# Patient Record
Sex: Male | Born: 1970 | Hispanic: No | Marital: Married | Smoking: Never smoker
Health system: Southern US, Community
[De-identification: ages and names within clinical notes are randomized; demographics above are authoritative.]

## PROBLEM LIST (undated history)

## (undated) DIAGNOSIS — Z789 Other specified health status: Secondary | ICD-10-CM

---

## 2018-01-27 ENCOUNTER — Emergency Department (HOSPITAL_COMMUNITY): Payer: Self-pay

## 2018-01-27 ENCOUNTER — Emergency Department (HOSPITAL_COMMUNITY)
Admission: EM | Admit: 2018-01-27 | Discharge: 2018-01-27 | Disposition: A | Discharge status: Home / Self Care | Payer: Self-pay | Attending: Emergency Medicine | Admitting: Emergency Medicine

## 2018-01-27 ENCOUNTER — Other Ambulatory Visit: Payer: Self-pay

## 2018-01-27 ENCOUNTER — Encounter (HOSPITAL_COMMUNITY): Payer: Self-pay

## 2018-01-27 DIAGNOSIS — J189 Pneumonia, unspecified organism: Secondary | ICD-10-CM | POA: Insufficient documentation

## 2018-01-27 HISTORY — DX: Other specified health status: Z78.9

## 2018-01-27 MED ORDER — BENZONATATE 100 MG PO CAPS: 100.0000 mg | ORAL_CAPSULE | Freq: Three times a day (TID) | ORAL | 0 refills | Status: DC

## 2018-01-27 MED ORDER — ALBUTEROL SULFATE HFA 108 (90 BASE) MCG/ACT IN AERS: 2.0000 | INHALATION_SPRAY | RESPIRATORY_TRACT | 3 refills | Status: AC | PRN

## 2018-01-27 MED ORDER — IBUPROFEN 800 MG PO TABS
800.0000 mg | ORAL_TABLET | Freq: Once | ORAL | Status: AC
  Administered 2018-01-27: 800 mg via ORAL
  Filled 2018-01-27: qty 1

## 2018-01-27 MED ORDER — AMOXICILLIN-POT CLAVULANATE 875-125 MG PO TABS: 1.0000 | ORAL_TABLET | Freq: Two times a day (BID) | ORAL | 0 refills | Status: AC

## 2018-01-27 MED ORDER — AMOXICILLIN-POT CLAVULANATE 875-125 MG PO TABS
1.0000 | ORAL_TABLET | Freq: Once | ORAL | Status: AC
  Administered 2018-01-27: 1 via ORAL
  Filled 2018-01-27: qty 1

## 2018-01-27 MED ORDER — BENZONATATE 100 MG PO CAPS
200.0000 mg | ORAL_CAPSULE | Freq: Once | ORAL | Status: AC
  Administered 2018-01-27: 200 mg via ORAL
  Filled 2018-01-27: qty 2

## 2018-01-27 NOTE — ED Provider Notes (Signed)
[facility] Provider [facility]   CSN: [id number] Arrival date & time: 01/27/18  1010     History   Chief Complaint Chief Complaint  Patient presents with  . Cough  . Fever    HPI Mason Barnes is a 47 y.o. male.  HPI  The patient is a 47 year old male, a language interpreter was used in the language of Arabic at the patient's request, the entire interview and exam was conducted using the interpreter.  The patient reports to me that he has no chronic lung conditions, he takes no daily medications, approximately 4 or 5 days ago the patient developed acute onset of coughing which is been persistent and gradually worsening.  He coughs consistently and constantly throughout the day and night, he often has sputum production and occasionally has some blood-tinged sputum.  He has had subjective fevers though he has not measured them and chills as well.  He does have some myalgias though this has improved and feels like it is more the pain associated with coughing in his bilateral abdomen.  No vomiting, no diarrhea, has taken over-the-counter medications with minimal relief.  He does report that he has a child who has recently been diagnosed with pneumonia who he has been around.  He denies any other sick contacts, recent travel or risk factors for pulmonary embolism.  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  Past Medical History:  Diagnosis Date  . Medical history non-contributory     There are no active problems to display for this patient.   History reviewed. No pertinent surgical history.      Home Medications    Prior to Admission medications   Medication Sig Start Date End Date Taking? Authorizing Provider  albuterol (PROVENTIL HFA;VENTOLIN HFA) 108 (90 Base) MCG/ACT inhaler Inhale 2 puffs into the lungs every 4 (four) hours as needed for wheezing or shortness of breath. 01/27/18   Eber HongMiller, Biannca Scantlin, MD  amoxicillin-clavulanate (AUGMENTIN)  875-125 MG tablet Take 1 tablet by mouth every 12 (twelve) hours. 01/27/18   Eber HongMiller, Cristin Szatkowski, MD  benzonatate (TESSALON) 100 MG capsule Take 1 capsule (100 mg total) by mouth every 8 (eight) hours. 01/27/18   Eber HongMiller, Oaklynn Stierwalt, MD    Family History History reviewed. No pertinent family history.  Social History Social History   Tobacco Use  . Smoking status: Never Smoker  . Smokeless tobacco: Never Used  Substance Use Topics  . Alcohol use: Not on file  . Drug use: Not on file     Allergies   Patient has no known allergies.   Review of Systems Review of Systems  All other systems reviewed and are negative.    Physical Exam Updated Vital Signs BP (!) 141/87   Pulse 96   Temp 99.2 F (37.3 C) (Oral)   Resp 18   SpO2 96%   Physical Exam  Constitutional: He appears well-developed and well-nourished. No distress.  HENT:  Head: Normocephalic and atraumatic.  Mouth/Throat: No oropharyngeal exudate.  Posterior pharynx is erythematous, no exudate asymmetry or hypertrophy of the tonsillar pillars, normal phonation  Eyes: Pupils are equal, round, and reactive to light. Conjunctivae and EOM are normal. Right eye exhibits no discharge. Left eye exhibits no discharge. No scleral icterus.  Neck: Normal range of motion. Neck supple. No JVD present. No thyromegaly present.  No thyromegaly or lymphadenopathy  Cardiovascular: Regular rhythm, normal heart sounds and intact distal pulses. Exam reveals no gallop and no friction rub.  No murmur heard.  Heart rate ranges from 95-105  Pulmonary/Chest: Effort normal and breath sounds normal. No respiratory distress. He has no wheezes. He has no rales.  Diffuse mild expiratory wheezing at end expiration, no increased work of breathing, no tachypnea, no rales  Abdominal: Soft. Bowel sounds are normal. He exhibits no distension and no mass. There is no tenderness.  Musculoskeletal: Normal range of motion. He exhibits no edema or tenderness.    Lymphadenopathy:    He has no cervical adenopathy.  Neurological: He is alert. Coordination normal.  Skin: Skin is warm and dry. No rash noted. No erythema.  Psychiatric: He has a normal mood and affect. His behavior is normal.  Nursing note and vitals reviewed.    ED Treatments / Results  Labs (all labs ordered are listed, but only abnormal results are displayed) Labs Reviewed - No data to display  EKG EKG Interpretation  Date/Time:  Tuesday January 27 2018 10:21:47 EST Ventricular Rate:  93 PR Interval:  144 QRS Duration: 78 QT Interval:  310 QTC Calculation: 385 R Axis:   65 Text Interpretation:  Normal sinus rhythm Septal infarct , age undetermined Abnormal ECG No old tracing to compare Confirmed by Eber Hong ([id number]) on 01/27/2018 11:23:19 AM   Radiology Dg Chest 2 View  Result Date: 01/27/2018 CLINICAL DATA:  Cough, shortness of breath, and intermittent fever for the past 4 days. Small amount of hemoptysis this morning. Nonsmoker. EXAM: CHEST - 2 VIEW COMPARISON:  None. FINDINGS: The lungs are adequately inflated. The interstitial markings are coarse bilaterally. There is no discrete infiltrate. The heart and pulmonary vascularity are normal. The trachea is midline. The bony thorax exhibits no acute abnormality. IMPRESSION: Mildly increased lung markings especially in the perihilar and infrahilar regions. This may be chronic or could reflect acute subsegmental atelectasis or early pneumonia. Correlation with clinical and laboratory values is needed. Follow-up radiographs following any antibiotic therapy would be useful to assure clearing. If the patient's hemoptysis persists or worsens, chest CT scanning may be the most useful next imaging step. Electronically Signed   By: David  [place] M.D.   On: 01/27/2018 10:49    Procedures Procedures (including critical care time)  Medications Ordered in ED Medications  benzonatate (TESSALON) capsule 200 mg (has no  administration in time range)  amoxicillin-clavulanate (AUGMENTIN) 875-125 MG per tablet 1 tablet (has no administration in time range)  ibuprofen (ADVIL,MOTRIN) tablet 800 mg (has no administration in time range)     Initial Impression / Assessment and Plan / ED Course  I have reviewed the triage vital signs and the nursing notes.  Pertinent labs & imaging results that were available during my care of the patient were reviewed by me and considered in my medical decision making (see chart for details).     The patient's EKG is unremarkable, his chest x-ray shows some hilar markings, there is no obvious infiltrates however with the patient's story of a progressive respiratory illness without any other upper respiratory symptoms and no flu exposures the patient will be treated with Augmentin, Tessalon and albuterol.  He is aware that his x-ray did not show any signs of obvious pneumonia, he is aware that this could be viral and may be influenza however at this time there is no specific treatment for influenza B on the 48-hour mark.  He is not hypoxic tachypneic or in respiratory distress and because of his expiratory wheezing he will be given albuterol for home.  The patient is agreeable to the treatment plan  and will follow-up closely, he will be given a work note for 48 hours.  All questions were answered using the interpreter interface.  Final Clinical Impressions(s) / ED Diagnoses   Final diagnoses:  Community acquired pneumonia, unspecified laterality    ED Discharge Orders         Ordered    albuterol (PROVENTIL HFA;VENTOLIN HFA) 108 (90 Base) MCG/ACT inhaler  Every 4 hours PRN     01/27/18 1142    amoxicillin-clavulanate (AUGMENTIN) 875-125 MG tablet  Every 12 hours     01/27/18 1142    benzonatate (TESSALON) 100 MG capsule  Every 8 hours     01/27/18 1142           Eber Hong, MD 01/27/18 1143

## 2018-01-27 NOTE — ED Triage Notes (Signed)
Pt here with sob reports cough, sob and fever X4 days. Pt reports this morning he coughed up a very small amount of blood. Cough present in triage.

## 2018-01-27 NOTE — Discharge Instructions (Signed)
You are to return to the emergency department if you develop increasing chest pain, difficulty breathing, fevers or start to produce more blood when you cough.  Your x-ray does not show a definitive pneumonia but because of your fevers and increasing coughing we will treat you with Augmentin, take this medication twice a day for the next 7 days as an antibiotic   Please take albuterol inhaler, 2 puffs every 4 hours as needed for coughing shortness of breath or wheezing   Please take Tessalon, 100 mg by mouth every 8 hours to help reduce coughing   You should return to the emergency department for increasing pain difficulty breathing fevers or if you are doing poorly and not improving in the expected timeframe of 2 or 3 days.

## 2018-01-27 NOTE — ED Notes (Signed)
ED Provider at bedside. 

## 2018-02-04 ENCOUNTER — Encounter (HOSPITAL_COMMUNITY): Payer: Self-pay | Admitting: Emergency Medicine

## 2018-02-04 ENCOUNTER — Emergency Department (HOSPITAL_COMMUNITY)
Admission: EM | Admit: 2018-02-04 | Discharge: 2018-02-05 | Disposition: A | Discharge status: Home / Self Care | Payer: Self-pay | Attending: Emergency Medicine | Admitting: Emergency Medicine

## 2018-02-04 ENCOUNTER — Emergency Department (HOSPITAL_COMMUNITY): Payer: Self-pay

## 2018-02-04 DIAGNOSIS — J40 Bronchitis, not specified as acute or chronic: Secondary | ICD-10-CM | POA: Insufficient documentation

## 2018-02-04 MED ORDER — PREDNISONE 10 MG PO TABS: ORAL_TABLET | ORAL | 0 refills | Status: AC

## 2018-02-04 MED ORDER — ALBUTEROL (5 MG/ML) CONTINUOUS INHALATION SOLN
10.0000 mg/h | INHALATION_SOLUTION | RESPIRATORY_TRACT | Status: DC
  Administered 2018-02-04: 10 mg/h via RESPIRATORY_TRACT
  Filled 2018-02-04: qty 20

## 2018-02-04 MED ORDER — IPRATROPIUM-ALBUTEROL 0.5-2.5 (3) MG/3ML IN SOLN
3.0000 mL | Freq: Once | RESPIRATORY_TRACT | Status: AC
  Administered 2018-02-04: 3 mL via RESPIRATORY_TRACT
  Filled 2018-02-04: qty 3

## 2018-02-04 MED ORDER — ALBUTEROL SULFATE (2.5 MG/3ML) 0.083% IN NEBU
5.0000 mg | INHALATION_SOLUTION | Freq: Once | RESPIRATORY_TRACT | Status: AC
  Administered 2018-02-04: 5 mg via RESPIRATORY_TRACT
  Filled 2018-02-04: qty 6

## 2018-02-04 MED ORDER — METHYLPREDNISOLONE SODIUM SUCC 125 MG IJ SOLR
125.0000 mg | Freq: Once | INTRAMUSCULAR | Status: AC
  Administered 2018-02-04: 125 mg via INTRAVENOUS
  Filled 2018-02-04: qty 2

## 2018-02-04 NOTE — ED Triage Notes (Signed)
Pt presents to ED for assessment of sob, cough, congestion since last week when he was seen and started on abx.  Patient has finished them, states he is not feeling better.  SpO2 at 92% on RA when talking.

## 2018-02-04 NOTE — ED Provider Notes (Signed)
[facility] EMERGENCY DEPARTMENT Provider Note   CSN: [id number] Arrival date & time: 02/04/18  1725     History   Chief Complaint No chief complaint on file.   HPI Mason Barnes is a 47 y.o. male.  The history is provided by the patient and the spouse. No language interpreter was used.  Cough  This is a new problem. The current episode started more than 1 week ago. The problem occurs constantly. The problem has been gradually improving. The cough is non-productive. There has been no fever. Associated symptoms include shortness of breath and wheezing. Pertinent negatives include no chest pain. He has tried nothing for the symptoms. He is not a smoker. His past medical history does not include pneumonia.  Pt was seen here 8 days ago and diagnosed with pneumonia.  Pt has finished the antibiotic.  Pt complains of a cough and shortness of breath.   Past Medical History:  Diagnosis Date  . Medical history non-contributory     There are no active problems to display for this patient.   History reviewed. No pertinent surgical history.      Home Medications    Prior to Admission medications   Medication Sig Start Date End Date Taking? Authorizing Provider  albuterol (PROVENTIL HFA;VENTOLIN HFA) 108 (90 Base) MCG/ACT inhaler Inhale 2 puffs into the lungs every 4 (four) hours as needed for wheezing or shortness of breath. 01/27/18   Eber Hong, MD  amoxicillin-clavulanate (AUGMENTIN) 875-125 MG tablet Take 1 tablet by mouth every 12 (twelve) hours. 01/27/18   Eber Hong, MD  benzonatate (TESSALON) 100 MG capsule Take 1 capsule (100 mg total) by mouth every 8 (eight) hours. 01/27/18   Eber Hong, MD    Family History History reviewed. No pertinent family history.  Social History Social History   Tobacco Use  . Smoking status: Never Smoker  . Smokeless tobacco: Never Used  Substance Use Topics  . Alcohol use: Never    Frequency: Never  . Drug  use: Never     Allergies   Patient has no known allergies.   Review of Systems Review of Systems  Respiratory: Positive for cough, shortness of breath and wheezing.   Cardiovascular: Negative for chest pain.  All other systems reviewed and are negative.    Physical Exam Updated Vital Signs BP 133/72 (BP Location: Left Arm)   Pulse 87   Temp 98.6 F (37 C) (Oral)   Resp 18   SpO2 91%   Physical Exam  Constitutional: He appears well-developed and well-nourished.  HENT:  Head: Normocephalic and atraumatic.  Eyes: Conjunctivae are normal.  Neck: Neck supple.  Cardiovascular: Normal rate and regular rhythm.  No murmur heard. Pulmonary/Chest: Effort normal. No respiratory distress. He has wheezes.  Abdominal: Soft. There is no tenderness.  Musculoskeletal: He exhibits no edema.  Neurological: He is alert.  Skin: Skin is warm and dry.  Psychiatric: He has a normal mood and affect.  Nursing note and vitals reviewed.    ED Treatments / Results  Labs (all labs ordered are listed, but only abnormal results are displayed) Labs Reviewed - No data to display  EKG None  Radiology Dg Chest 2 View  Result Date: 02/04/2018 CLINICAL DATA:  Persistent productive cough, congestion EXAM: CHEST - 2 VIEW COMPARISON:  01/27/2018 FINDINGS: Peribronchial thickening and interstitial prominence, likely bronchitis. Heart and mediastinal contours are within normal limits. No focal opacities or effusions. No acute bony abnormality. IMPRESSION: Bronchitic changes. Electronically Signed  By: Charlett NoseKevin  Dover M.D.   On: 02/04/2018 19:36    Procedures Procedures (including critical care time)  Medications Ordered in ED Medications  albuterol (PROVENTIL) (2.5 MG/3ML) 0.083% nebulizer solution 5 mg (5 mg Nebulization Given 02/04/18 1815)  ipratropium-albuterol (DUONEB) 0.5-2.5 (3) MG/3ML nebulizer solution 3 mL (3 mLs Nebulization Given 02/04/18 2057)  methylPREDNISolone sodium succinate  (SOLU-MEDROL) 125 mg/2 mL injection 125 mg (125 mg Intravenous Given 02/04/18 2101)     Initial Impression / Assessment and Plan / ED Course  I have reviewed the triage vital signs and the nursing notes.  Pertinent labs & imaging results that were available during my care of the patient were reviewed by me and considered in my medical decision making (see chart for details).     MDM  Chest xray shows bronchitis,  Pt given duoneb and solumedrol.  Pt continued wheezing after duoneb.  Pt started on one hour continuous.   Final Clinical Impressions(s) / ED Diagnoses   Final diagnoses:  Bronchitis    ED Discharge Orders    None       Osie CheeksSofia, Demisha Nokes K, PA-C 02/04/18 2141    Charlynne PanderYao, David Hsienta, MD 02/05/18 2038

## 2018-02-04 NOTE — ED Notes (Signed)
Pt accidentally spilled all of his cont neb in the bed, respiratory notifies to bring a new one.

## 2018-02-04 NOTE — Discharge Instructions (Addendum)
Return if any problems.

## 2018-02-05 MED ORDER — BENZONATATE 100 MG PO CAPS: 100.0000 mg | ORAL_CAPSULE | Freq: Three times a day (TID) | ORAL | 0 refills | Status: AC

## 2019-11-19 ENCOUNTER — Other Ambulatory Visit: Payer: Self-pay

## 2019-11-19 ENCOUNTER — Emergency Department (HOSPITAL_COMMUNITY): Payer: 59

## 2019-11-19 ENCOUNTER — Emergency Department (HOSPITAL_COMMUNITY)
Admission: EM | Admit: 2019-11-19 | Discharge: 2019-11-20 | Disposition: A | Discharge status: Other Acute Inpatient Hospital | Payer: 59 | Attending: Emergency Medicine | Admitting: Emergency Medicine

## 2019-11-19 DIAGNOSIS — R110 Nausea: Secondary | ICD-10-CM | POA: Insufficient documentation

## 2019-11-19 DIAGNOSIS — R1030 Lower abdominal pain, unspecified: Secondary | ICD-10-CM

## 2019-11-19 DIAGNOSIS — R1032 Left lower quadrant pain: Secondary | ICD-10-CM | POA: Diagnosis present

## 2019-11-19 LAB — CBC WITH DIFFERENTIAL/PLATELET
Abs Immature Granulocytes: 0.03 10*3/uL (ref 0.00–0.07)
Basophils Absolute: 0.1 10*3/uL (ref 0.0–0.1)
Basophils Relative: 1 %
Eosinophils Absolute: 0.3 10*3/uL (ref 0.0–0.5)
Eosinophils Relative: 3 %
HCT: 49.4 % (ref 39.0–52.0)
Hemoglobin: 16.2 g/dL (ref 13.0–17.0)
Immature Granulocytes: 0 %
Lymphocytes Relative: 35 %
Lymphs Abs: 2.9 10*3/uL (ref 0.7–4.0)
MCH: 29.7 pg (ref 26.0–34.0)
MCHC: 32.8 g/dL (ref 30.0–36.0)
MCV: 90.5 fL (ref 80.0–100.0)
Monocytes Absolute: 0.7 10*3/uL (ref 0.1–1.0)
Monocytes Relative: 9 %
Neutro Abs: 4.4 10*3/uL (ref 1.7–7.7)
Neutrophils Relative %: 52 %
Platelets: 228 10*3/uL (ref 150–400)
RBC: 5.46 MIL/uL (ref 4.22–5.81)
RDW: 12.2 % (ref 11.5–15.5)
WBC: 8.4 10*3/uL (ref 4.0–10.5)
nRBC: 0 % (ref 0.0–0.2)

## 2019-11-19 LAB — COMPREHENSIVE METABOLIC PANEL
ALT: 56 U/L — ABNORMAL HIGH (ref 0–44)
AST: 37 U/L (ref 15–41)
Albumin: 4.4 g/dL (ref 3.5–5.0)
Alkaline Phosphatase: 45 U/L (ref 38–126)
Anion gap: 8 (ref 5–15)
BUN: 11 mg/dL (ref 6–20)
CO2: 26 mmol/L (ref 22–32)
Calcium: 10.3 mg/dL (ref 8.9–10.3)
Chloride: 103 mmol/L (ref 98–111)
Creatinine, Ser: 0.72 mg/dL (ref 0.61–1.24)
GFR calc Af Amer: >60 mL/min (ref >60)
GFR calc non Af Amer: >60 mL/min (ref >60)
Glucose, Bld: 95 mg/dL (ref 70–99)
Potassium: 4.1 mmol/L (ref 3.5–5.1)
Sodium: 137 mmol/L (ref 135–145)
Total Bilirubin: 0.7 mg/dL (ref 0.3–1.2)
Total Protein: 7.4 g/dL (ref 6.5–8.1)

## 2019-11-19 LAB — LIPASE, BLOOD: Lipase: 46 U/L (ref 11–51)

## 2019-11-19 MED ORDER — IOHEXOL 300 MG/ML  SOLN
100.0000 mL | Freq: Once | INTRAMUSCULAR | Status: AC | PRN
  Administered 2019-11-19: 100 mL via INTRAVENOUS

## 2019-11-19 MED ORDER — ONDANSETRON 4 MG PO TBDP: 4.0000 mg | ORAL_TABLET | Freq: Once | ORAL | Status: DC

## 2019-11-19 MED ORDER — HYDROCODONE-ACETAMINOPHEN 5-325 MG PO TABS: 1.0000 | ORAL_TABLET | Freq: Once | ORAL | Status: DC

## 2019-11-19 MED ORDER — MORPHINE SULFATE (PF) 4 MG/ML IV SOLN
4.0000 mg | Freq: Once | INTRAVENOUS | Status: AC
  Administered 2019-11-19: 4 mg via INTRAVENOUS
  Filled 2019-11-19: qty 1

## 2019-11-19 MED ORDER — ONDANSETRON HCL 4 MG/2ML IJ SOLN
4.0000 mg | Freq: Once | INTRAMUSCULAR | Status: AC
  Administered 2019-11-19: 4 mg via INTRAVENOUS
  Filled 2019-11-19: qty 2

## 2019-11-19 NOTE — ED Triage Notes (Signed)
Emergency Medicine Provider Triage Evaluation Note  Mason Barnes , a 49 y.o. male  was evaluated in triage.  Pt complains of left-sided abdominal pain, nausea, vomiting.  5 days of symptoms.  No history of similar.  No known history of diverticulitis.  Reports normal BMs, no blood.  No urinary symptoms..  Review of Systems  Positive: Abdominal pain, nausea, vomiting Negative: Fever, urinary symptoms, abnormal bowel movements  Physical Exam  BP 138/74 (BP Location: Left Arm)   Pulse 75   Temp 98.9 F (37.2 C) (Oral)   Resp 18   SpO2 97%  Gen:   Awake, no distress  HEENT:  Atraumatic  Resp:  Normal effort  Cardiac:  Normal rate  Abd:   TTP of diffuse abdomen, worse in left side.  Left CVA tenderness. MSK:   Moves extremities without difficulty  Neuro:  Speech clear   Medical Decision Making  Medically screening exam initiated at 1:52 PM.  Appropriate orders placed.  Mason Barnes was informed that the remainder of the evaluation will be completed by another provider, this initial triage assessment does not replace that evaluation, and the importance of remaining in the ED until their evaluation is complete.  Clinical Impression   Left-sided abdominal pain with associated nausea and vomiting.  Consider diverticulitis.  Consider kidney stone due to CVA tenderness.  Consider pyelo, though less likely without urinary symptoms.  Will obtain labs, urine, CT abdomen pelvis for further evaluation.   Mason Apley, PA-C 11/19/19 1354

## 2019-11-19 NOTE — ED Triage Notes (Signed)
C/O left lower abdominal pain x 5 days; along w/ bitter taste, c/o nausea and bloating. Denies PMH.

## 2019-11-19 NOTE — ED Notes (Signed)
Pt called for vitals no response 3x 

## 2019-11-20 LAB — URINALYSIS, ROUTINE W REFLEX MICROSCOPIC
Bilirubin Urine: NEGATIVE
Glucose, UA: NEGATIVE mg/dL
Hgb urine dipstick: NEGATIVE
Ketones, ur: NEGATIVE mg/dL
Leukocytes,Ua: NEGATIVE
Nitrite: NEGATIVE
Protein, ur: NEGATIVE mg/dL
Specific Gravity, Urine: 1.028 (ref 1.005–1.030)
pH: 6 (ref 5.0–8.0)

## 2019-11-20 MED ORDER — DICYCLOMINE HCL 20 MG PO TABS: 20.0000 mg | ORAL_TABLET | Freq: Three times a day (TID) | ORAL | 0 refills | Status: AC | PRN

## 2019-11-20 MED ORDER — ONDANSETRON 4 MG PO TBDP: 4.0000 mg | ORAL_TABLET | Freq: Three times a day (TID) | ORAL | 0 refills | Status: AC | PRN

## 2019-11-20 MED ORDER — SODIUM CHLORIDE 0.9 % IV BOLUS
1000.0000 mL | Freq: Once | INTRAVENOUS | Status: AC
  Administered 2019-11-20: 1000 mL via INTRAVENOUS

## 2019-11-20 MED ORDER — DICYCLOMINE HCL 10 MG PO CAPS
20.0000 mg | ORAL_CAPSULE | Freq: Once | ORAL | Status: AC
  Administered 2019-11-20: 20 mg via ORAL
  Filled 2019-11-20: qty 2

## 2019-11-20 NOTE — ED Provider Notes (Signed)
[facility] EMERGENCY DEPARTMENT Provider Note   CSN: [id number] Arrival date & time: 11/19/19  1316     History Chief Complaint  Patient presents with  . Abdominal Pain    Mason Barnes is a 49 y.o. male with a history of prior appendectomy who presents to the ED with complaints of abdominal pain x 5 days. Patient states pain is in the LLQ, waxes/wanes, feels like a poking pain, currently a 4/10 in severity improved S/p morphine. Has some nausea and increase salivation. Last BM today was normal. Denies fever, chills, emesis, hematemesis, melena, hematochezia, diarrhea, constipation, dysuria, chest pain, or dyspnea.   Interpretor utilized throughout Audiological scientist.   HPI     Past Medical History:  Diagnosis Date  . Medical history non-contributory     There are no problems to display for this patient.   No past surgical history on file.     No family history on file.  Social History   Tobacco Use  . Smoking status: Never Smoker  . Smokeless tobacco: Never Used  Substance Use Topics  . Alcohol use: Never  . Drug use: Never    Home Medications Prior to Admission medications   Medication Sig Start Date End Date Taking? Authorizing Provider  albuterol (PROVENTIL HFA;VENTOLIN HFA) 108 (90 Base) MCG/ACT inhaler Inhale 2 puffs into the lungs every 4 (four) hours as needed for wheezing or shortness of breath. 01/27/18   Eber Hong, MD  amoxicillin-clavulanate (AUGMENTIN) 875-125 MG tablet Take 1 tablet by mouth every 12 (twelve) hours. 01/27/18   Eber Hong, MD  benzonatate (TESSALON) 100 MG capsule Take 1 capsule (100 mg total) by mouth every 8 (eight) hours. 02/05/18   Charlynne Pander, MD  predniSONE (DELTASONE) 10 MG tablet 6,5,4,3,2,1 taper 02/04/18   Elson Areas, PA-C    Allergies    Patient has no known allergies.  Review of Systems   Review of Systems  Constitutional: Negative for chills and fever.  Respiratory: Negative for cough  and shortness of breath.   Cardiovascular: Negative for chest pain.  Gastrointestinal: Positive for abdominal pain and nausea. Negative for anal bleeding, blood in stool, constipation, diarrhea and vomiting.  Genitourinary: Negative for dysuria, frequency, scrotal swelling, testicular pain and urgency.  Neurological: Negative for syncope.  All other systems reviewed and are negative.   Physical Exam Updated Vital Signs BP 133/77   Pulse 88   Temp 97.8 F (36.6 C) (Oral)   Resp 17   SpO2 98%   Physical Exam Vitals and nursing note reviewed.  Constitutional:      General: He is not in acute distress.    Appearance: He is well-developed. He is not toxic-appearing.  HENT:     Head: Normocephalic and atraumatic.  Eyes:     General:        Right eye: No discharge.        Left eye: No discharge.     Conjunctiva/sclera: Conjunctivae normal.  Cardiovascular:     Rate and Rhythm: Normal rate and regular rhythm.  Pulmonary:     Effort: Pulmonary effort is normal. No respiratory distress.     Breath sounds: Normal breath sounds. No wheezing, rhonchi or rales.  Abdominal:     General: There is no distension.     Palpations: Abdomen is soft.     Tenderness: There is abdominal tenderness (lower abdomen). There is no guarding or rebound.  Musculoskeletal:     Cervical back: Neck supple.  Skin:  General: Skin is warm and dry.     Findings: No rash.  Neurological:     Mental Status: He is alert.     Comments: Clear speech.   Psychiatric:        Behavior: Behavior normal.     ED Results / Procedures / Treatments   Labs (all labs ordered are listed, but only abnormal results are displayed) Labs Reviewed  COMPREHENSIVE METABOLIC PANEL - Abnormal; Notable for the following components:      Result Value   ALT 56 (*)    All other components within normal limits  LIPASE, BLOOD  CBC WITH DIFFERENTIAL/PLATELET  URINALYSIS, ROUTINE W REFLEX MICROSCOPIC     EKG None  Radiology CT ABDOMEN PELVIS W CONTRAST  Result Date: 11/19/2019 CLINICAL DATA:  Abdominal pain diverticulitis suspected EXAM: CT ABDOMEN AND PELVIS WITH CONTRAST TECHNIQUE: Multidetector CT imaging of the abdomen and pelvis was performed using the standard protocol following bolus administration of intravenous contrast. CONTRAST:  OMNIPAQUE IOHEXOL 300 MG/ML  SOLN COMPARISON:  None. FINDINGS: Lower chest: The visualized heart size within normal limits. No pericardial fluid/thickening. No hiatal hernia. The visualized portions of the lungs are clear. Hepatobiliary: The liver is normal in density without focal abnormality.The main portal vein is patent. No evidence of calcified gallstones, gallbladder wall thickening or biliary dilatation. Pancreas: Unremarkable. No pancreatic ductal dilatation or surrounding inflammatory changes. Spleen: Normal in size without focal abnormality. Adrenals/Urinary Tract: Both adrenal glands appear normal. The kidneys and collecting system appear normal without evidence of urinary tract calculus or hydronephrosis. Bladder is unremarkable. Stomach/Bowel: The stomach, small bowel, and colon are normal in appearance. No inflammatory changes, wall thickening, or obstructive findings.Scattered colonic diverticula are noted without diverticulitis. Vascular/Lymphatic: There are no enlarged mesenteric, retroperitoneal, or pelvic lymph nodes. No significant vascular findings are present. Reproductive: The prostate is unremarkable. Other: No evidence of abdominal wall mass or hernia. Musculoskeletal: No acute or significant osseous findings. IMPRESSION: Diverticulosis without diverticulitis. No other acute intra-abdominopelvic pathology to explain the patient's symptoms. Electronically Signed   By: Jonna Clark M.D.   On: 11/19/2019 17:36    Procedures Procedures (including critical care time)  Medications Ordered in ED Medications  ondansetron (ZOFRAN)  injection 4 mg (4 mg Intravenous Given 11/19/19 1415)  morphine 4 MG/ML injection 4 mg (4 mg Intravenous Given 11/19/19 1415)  iohexol (OMNIPAQUE) 300 MG/ML solution 100 mL (100 mLs Intravenous Contrast Given 11/19/19 1708)    ED Course  I have reviewed the triage vital signs and the nursing notes.  Pertinent labs & imaging results that were available during my care of the patient were reviewed by me and considered in my medical decision making (see chart for details).    MDM Rules/Calculators/A&P                         Patient presents to the ED with complaints of abdominal pain.  Nontoxic, vitals WNL Exam with lower abdominal tenderness w/o peritoneal signs.   Additional history obtained:  Additional history obtained from chart & nursing note review.   Lab Tests:  I reviewed and interpreted labs, which included:  CBC, CMP, lipase, UA:  no significant abnormalities.   Imaging Studies ordered:  CT A/P ordered by provider in triage, I independently visualized and interpreted imaging which showed diverticulosis w/o diverticulitis, no acute process.   Fluids & bentyl ordered.  Repeat abdominal exam without peritoneal signs, low suspicion for acute surgical process w/ reassuring  labs, imaging, & serial exams. Patient tolerating PO and feeling somewhat improved Will discharge home with bentyl & zofran, PCP follow up. I discussed results, treatment plan, need for follow-up, and return precautions with the patient. Provided opportunity for questions, patient confirmed understanding and is in agreement with plan.   Portions of this note were generated with Scientist, clinical (histocompatibility and immunogenetics). Dictation errors may occur despite best attempts at proofreading.  Final Clinical Impression(s) / ED Diagnoses Final diagnoses:  Lower abdominal pain    Rx / DC Orders ED Discharge Orders         Ordered    ondansetron (ZOFRAN ODT) 4 MG disintegrating tablet  Every 8 hours PRN        11/20/19 0545     dicyclomine (BENTYL) 20 MG tablet  Every 8 hours PRN        11/20/19 0545           Cherly Anderson, PA-C 11/20/19 0551    Marily Memos, MD 11/20/19 2306

## 2019-11-20 NOTE — ED Notes (Signed)
ED Provider at bedside. 

## 2019-11-20 NOTE — ED Notes (Signed)
Patient verbalizes understanding of discharge instructions. Opportunity for questioning and answers were provided. Pt discharged from ED stable & ambulatory.   

## 2019-11-20 NOTE — Discharge Instructions (Addendum)
You were seen in the ER today for abdominal pain.  Your CT scan showed diverticulosis- see attached hand out. Your labs were reassuring. We are unsure of the exact cause of your abdominal pain, this may be a virus. We are sending you home with bentyl to take every 8 hours as needed for pain and zofran to take every 8 hours as needed for nausea.   We have prescribed you new medication(s) today. Discuss the medications prescribed today with your pharmacist as they can have adverse effects and interactions with your other medicines including over the counter and prescribed medications. Seek medical evaluation if you start to experience new or abnormal symptoms after taking one of these medicines, seek care immediately if you start to experience difficulty breathing, feeling of your throat closing, facial swelling, or rash as these could be indications of a more serious allergic reaction  Please follow up with primary care within 3 days. Return to the ER for new or worsening symptoms including but not limited to worsened pain, change in quality/location of pain, inability to keep fluids down, blood in vomit/stool, or any other concerns.   English to Arabic Google translate:  ??? ????? ?? ???? ??????? ????? ???? ???? ?? ?????. ???? ??????? ??????? ??????? ??? ????? - ???? ???? ???????. ???? ???????? ??????. ??? ??? ??????? ?? ????? ?????? ???? ????? ? ??? ???? ??? ???????. ??? ???? ?? ??? ?????? ?? ????? ????? ?? 8 ????? ??? ?????? ????? ????????? ?? 8 ????? ??? ?????? ???????.  ??? ????? ?? ????? ????? ?????. ???? ??????? ???????? ????? ?? ??????? ????? ?? ??? ???? ?? ???? ??? ???? ????? ???????? ?? ?????? ?????? ??? ?? ??? ??????? ???? ?? ?????? ???? ???? ???????? ????????. ???? ??????? ????? ??? ???? ?? ?????? ?????? ????? ?? ??? ?????? ??? ????? ??? ??? ??????? ? ???? ??????? ????? ??? ???? ????? ?? ????? ?? ?????? ? ?? ?????? ??????? ????? ? ?? ???? ????? ? ?? ????? ?????? ??? ??? ?? ???? ?????? ??? ????  ???? ???? ?????. ?? ??? ?????  ???? ???????? ?? ??????? ??????? ?? ???? 3 ????. ???? ??? ???? ??????? ?????? ??????? ??????? ?? ????????? ??? ?? ??? ??? ???? ?????? ?? ????? ????? ????? ? ?? ????? ???? / ???? ????? ? ?? ??? ?????? ??? ???????? ???????? ? ?? ???? ?? ?? ????? / ?????? ? ?? ?? ????? ????. laqad shuhidat fi ghurfat altawari alyawm bisabab alam fi albatni. 'azhar altaswir almaqtaeiu almuhawsib da' alrataj - anzur alyad almurfaqata. kanat mukhtabaratuk mutmayinatan. nahn ghayr muta'akidin min alsabab aldaqiq li'alam albatn , faqad yakun hadha fyrwsaan. nahn nursil lak 'iilaa almanzil mae bintil litakhudh kula 8 saeat hasab alhajat lil'alam walzuwfran kula 8 saeat hasab alhajat lilghathayani. laqad wasafna lak 'adwiat jadidat alyawma. naqash al'adwiat almusuft alyawm mae alsaydalii alkhasi bik hayth yumkin 'an yakun laha athar salbiat watafaeulat mae 'adwiatik al'ukhraa bima fi dhalik al'adwiat alati la tastalzim wasfatan tibiyatan wal'adwiat almwsufa. autlub tqyyman tbyan 'iidha bada'at fi alshueur bi'aerad jadidat 'aw ghayr tabieiat baed tanawul 'ahad hadhih al'adwiat , autlub alrieayat fwran 'iidha bada'at tueani min sueubat fi altanafus , 'aw alshueur biainghilaq alhalq , 'aw tawarum alwajh , 'aw altafh aljildii li'ana hadhih qad takun muashirat ealaa wujud halat 'akthar khuturatan. radi fiel tahasusiin yurjaa almutabaeat mae alrieayat al'awaliat fi ghudun 3 'ayaami. airjie 'iilaa ghurfat altawari limaerifat al'aerad aljadidat 'aw almutafaqimat bima fi dhalik ealaa sabil almithal la alhasr tafaqum al'alam , 'aw taghyir jawdat / mawqie al'alam , 'aw eadam alqudrat ealaa alaihtifaz bialsawayil , 'aw wujud dam  fi alqay' / albaraz , 'aw 'ayi makhawif 'ukhraa.

## 2019-12-03 IMAGING — DX DG CHEST 2V
2 series · 2 of 2 positions shown · non-contrast
Comparison: None.

CLINICAL DATA: Cough, shortness of breath, and intermittent fever
for the past 4 days. Small amount of hemoptysis this morning.
Nonsmoker.

EXAM:
CHEST - 2 VIEW

[w chest pa]
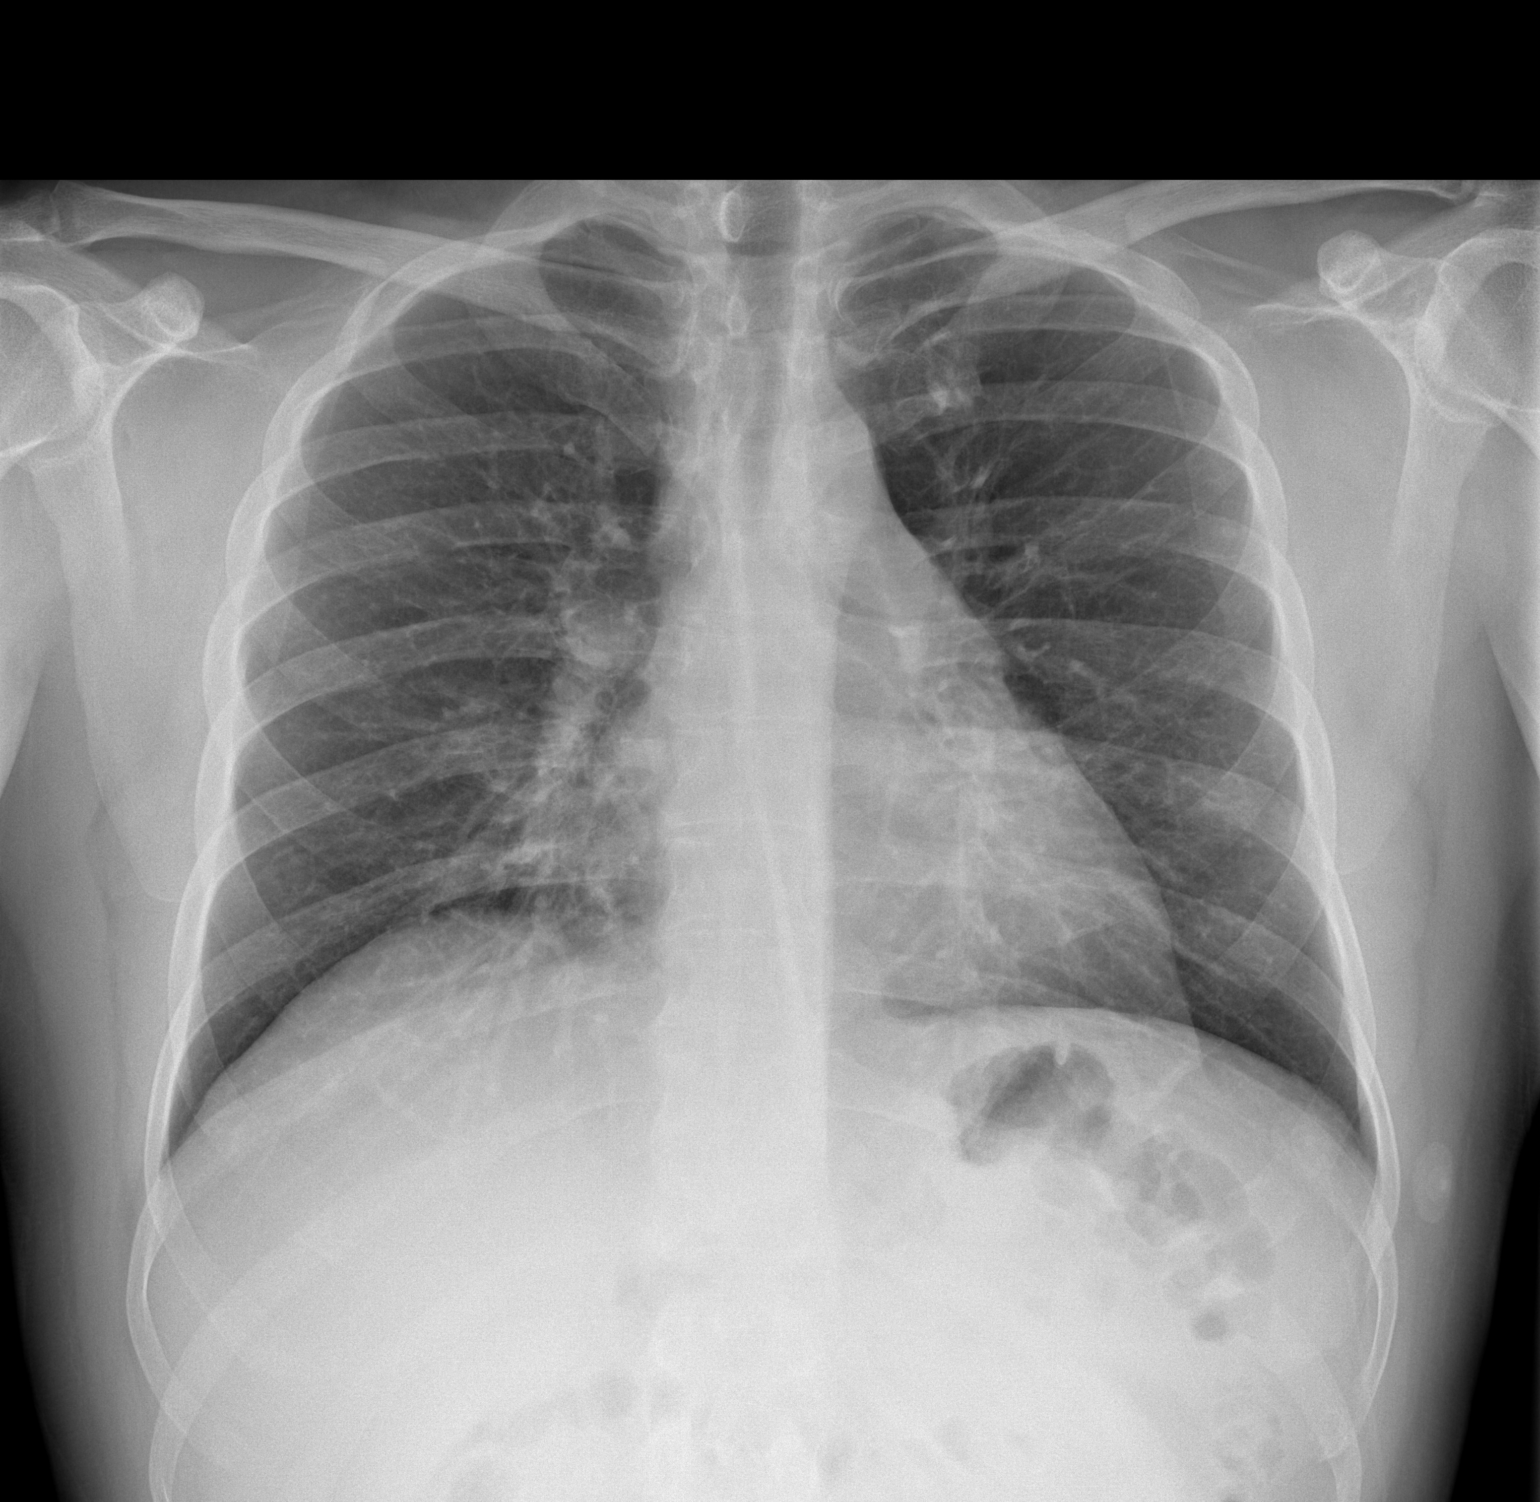

[w chest lat]
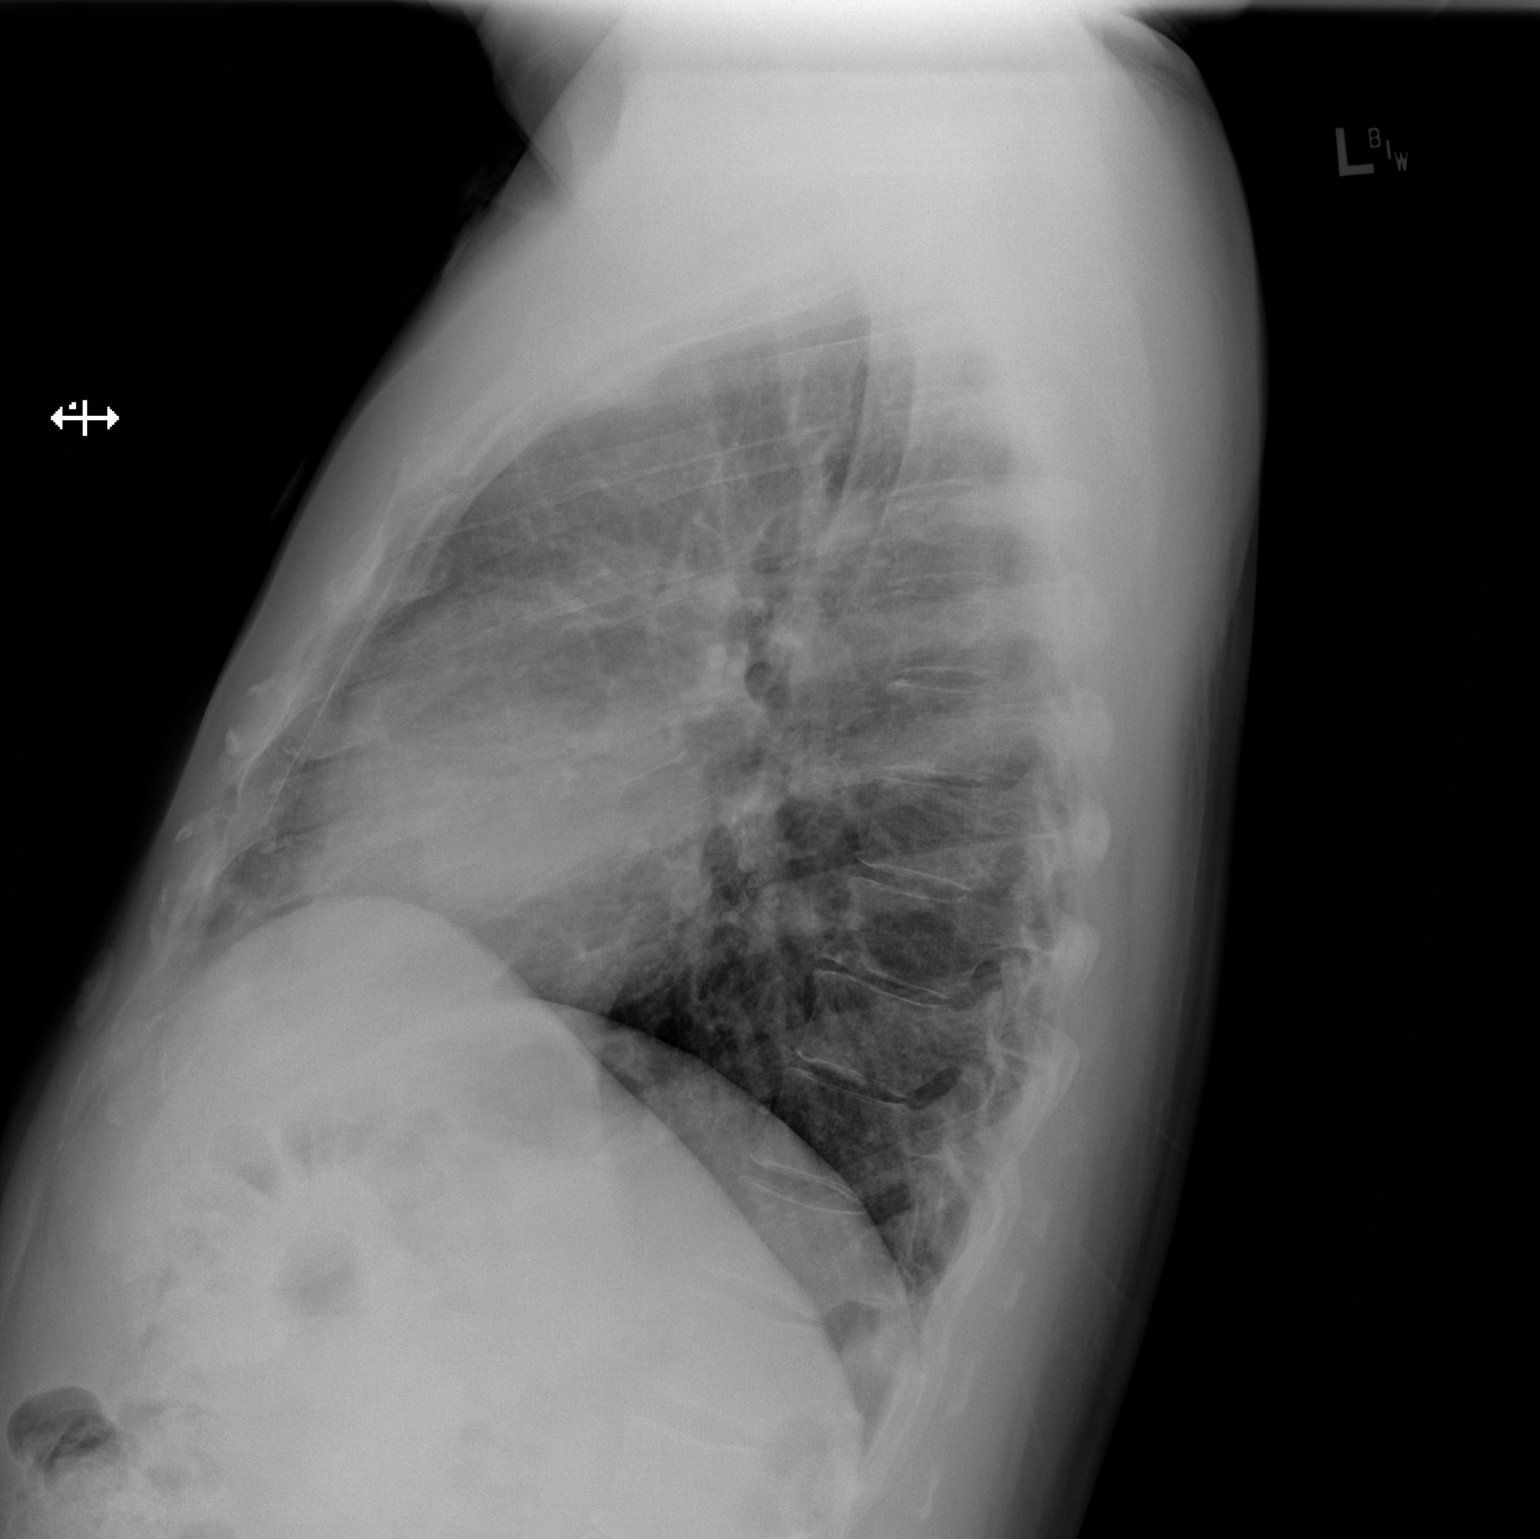

[2 of 2 positions shown; findings below may reference images not displayed]

FINDINGS: The lungs are adequately inflated. The interstitial markings are
coarse bilaterally. There is no discrete infiltrate. The heart and
pulmonary vascularity are normal. The trachea is midline. The bony
thorax exhibits no acute abnormality.
IMPRESSION: Mildly increased lung markings especially in the perihilar and
infrahilar regions. This may be chronic or could reflect acute
subsegmental atelectasis or early pneumonia. Correlation with
clinical and laboratory values is needed. Follow-up radiographs
following any antibiotic therapy would be useful to assure clearing.
If the patient's hemoptysis persists or worsens, chest CT scanning
may be the most useful next imaging step.

## 2019-12-11 IMAGING — CR DG CHEST 2V
2 series · 2 of 2 positions shown · non-contrast
Comparison: 01/27/2018

CLINICAL DATA: Persistent productive cough, congestion

EXAM:
CHEST - 2 VIEW

[chest pa]
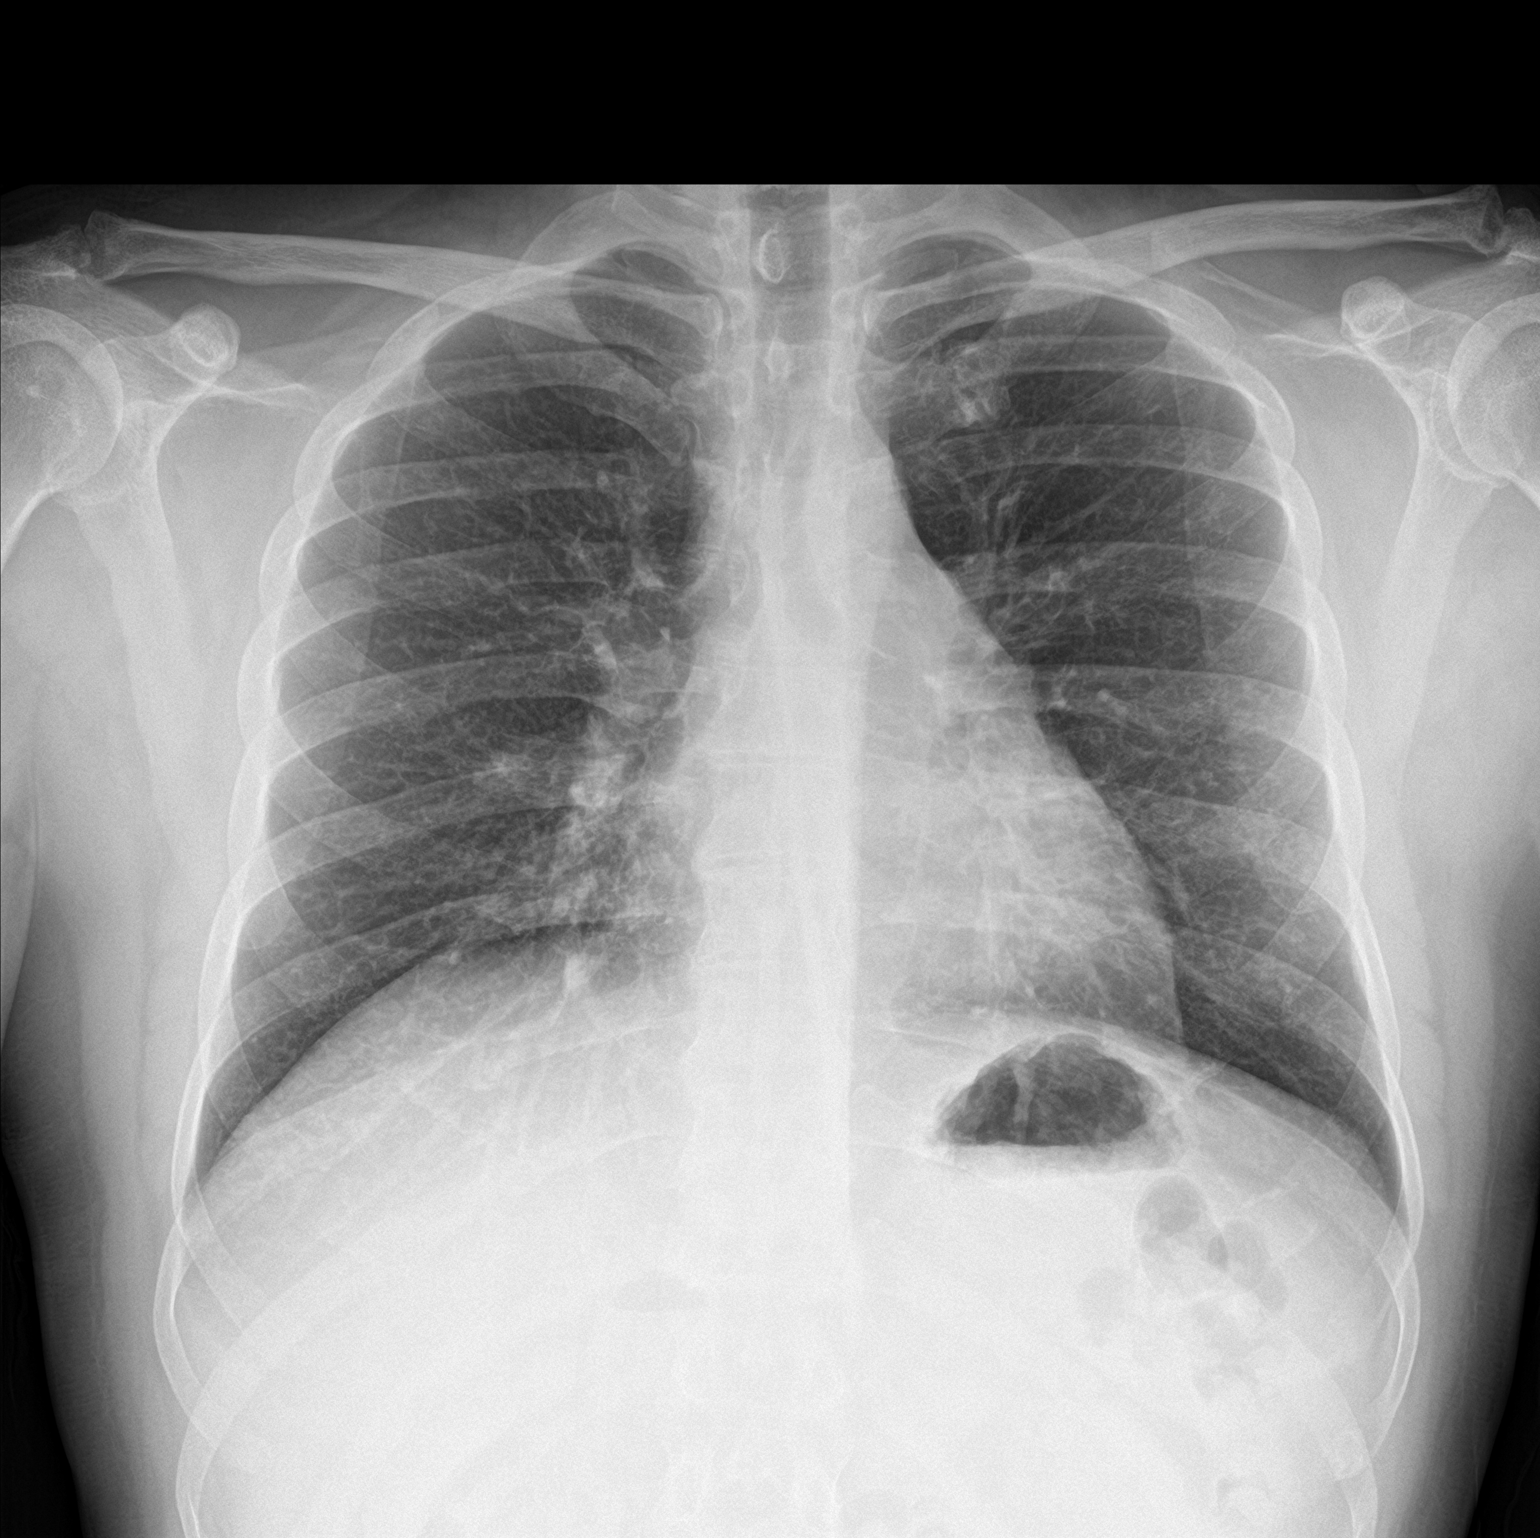

[chest lat]
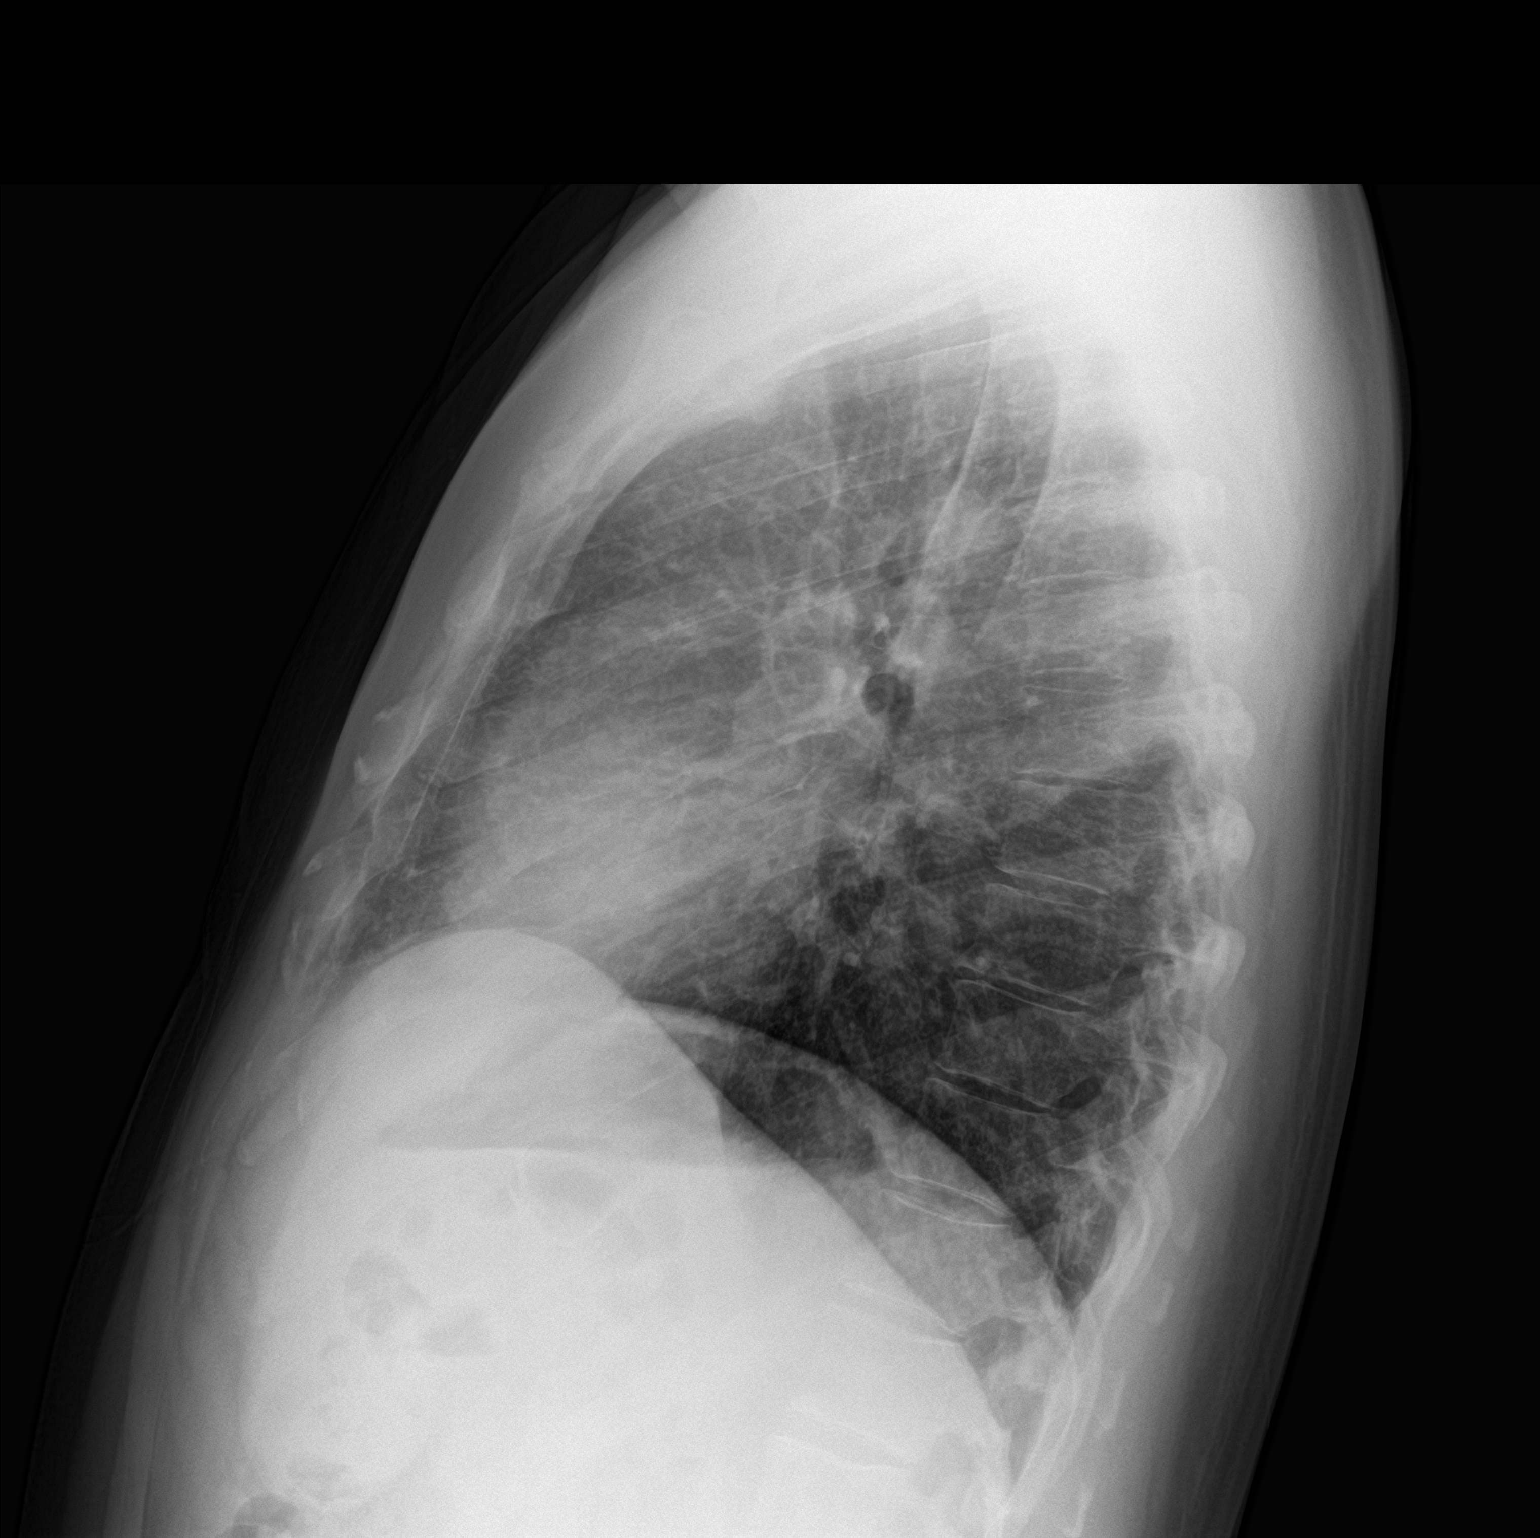

[2 of 2 positions shown; findings below may reference images not displayed]

FINDINGS: Peribronchial thickening and interstitial prominence, likely
bronchitis. Heart and mediastinal contours are within normal limits.
No focal opacities or effusions. No acute bony abnormality.
IMPRESSION: Bronchitic changes.

## 2020-06-05 ENCOUNTER — Other Ambulatory Visit: Payer: Self-pay

## 2020-06-05 ENCOUNTER — Emergency Department (HOSPITAL_COMMUNITY): Payer: 59

## 2020-06-05 ENCOUNTER — Encounter (HOSPITAL_COMMUNITY): Payer: Self-pay | Admitting: Radiology

## 2020-06-05 ENCOUNTER — Emergency Department (HOSPITAL_COMMUNITY)
Admission: EM | Admit: 2020-06-05 | Discharge: 2020-06-05 | Disposition: A | Discharge status: Home / Self Care | Payer: 59 | Attending: Emergency Medicine | Admitting: Emergency Medicine

## 2020-06-05 DIAGNOSIS — R109 Unspecified abdominal pain: Secondary | ICD-10-CM | POA: Diagnosis not present

## 2020-06-05 DIAGNOSIS — M542 Cervicalgia: Secondary | ICD-10-CM | POA: Diagnosis not present

## 2020-06-05 DIAGNOSIS — M545 Low back pain, unspecified: Secondary | ICD-10-CM | POA: Diagnosis not present

## 2020-06-05 LAB — CBC WITH DIFFERENTIAL/PLATELET
Abs Immature Granulocytes: 0.02 10*3/uL (ref 0.00–0.07)
Basophils Absolute: 0 10*3/uL (ref 0.0–0.1)
Basophils Relative: 1 %
Eosinophils Absolute: 0.2 10*3/uL (ref 0.0–0.5)
Eosinophils Relative: 3 %
HCT: 46.3 % (ref 39.0–52.0)
Hemoglobin: 15.6 g/dL (ref 13.0–17.0)
Immature Granulocytes: 0 %
Lymphocytes Relative: 39 %
Lymphs Abs: 2.1 10*3/uL (ref 0.7–4.0)
MCH: 30.4 pg (ref 26.0–34.0)
MCHC: 33.7 g/dL (ref 30.0–36.0)
MCV: 90.1 fL (ref 80.0–100.0)
Monocytes Absolute: 0.4 10*3/uL (ref 0.1–1.0)
Monocytes Relative: 8 %
Neutro Abs: 2.7 10*3/uL (ref 1.7–7.7)
Neutrophils Relative %: 49 %
Platelets: 224 10*3/uL (ref 150–400)
RBC: 5.14 MIL/uL (ref 4.22–5.81)
RDW: 12.4 % (ref 11.5–15.5)
WBC: 5.4 10*3/uL (ref 4.0–10.5)
nRBC: 0 % (ref 0.0–0.2)

## 2020-06-05 LAB — BASIC METABOLIC PANEL
Anion gap: 7 (ref 5–15)
BUN: 7 mg/dL (ref 6–20)
CO2: 26 mmol/L (ref 22–32)
Calcium: 10.1 mg/dL (ref 8.9–10.3)
Chloride: 103 mmol/L (ref 98–111)
Creatinine, Ser: 0.7 mg/dL (ref 0.61–1.24)
GFR, Estimated: >60 mL/min (ref >60)
Glucose, Bld: 111 mg/dL — ABNORMAL HIGH (ref 70–99)
Potassium: 4 mmol/L (ref 3.5–5.1)
Sodium: 136 mmol/L (ref 135–145)

## 2020-06-05 MED ORDER — IOHEXOL 300 MG/ML  SOLN
100.0000 mL | Freq: Once | INTRAMUSCULAR | Status: AC | PRN
  Administered 2020-06-05: 100 mL via INTRAVENOUS

## 2020-06-05 MED ORDER — ONDANSETRON HCL 4 MG/2ML IJ SOLN
4.0000 mg | Freq: Once | INTRAMUSCULAR | Status: AC
  Administered 2020-06-05: 4 mg via INTRAVENOUS
  Filled 2020-06-05: qty 2

## 2020-06-05 MED ORDER — MORPHINE SULFATE (PF) 4 MG/ML IV SOLN
4.0000 mg | Freq: Once | INTRAVENOUS | Status: AC
  Administered 2020-06-05: 4 mg via INTRAVENOUS
  Filled 2020-06-05: qty 1

## 2020-06-05 MED ORDER — SODIUM CHLORIDE 0.9 % IV BOLUS
500.0000 mL | Freq: Once | INTRAVENOUS | Status: AC
  Administered 2020-06-05: 500 mL via INTRAVENOUS

## 2020-06-05 NOTE — ED Provider Notes (Signed)
  ED Course/Procedures   Clinical Course as of 06/05/20 1843  Mon Jun 05, 2020  1540 Handoff: RLQ pain. Minimal history. CT AP with contrast.  [CC]    Clinical Course User Index [CC] Glyn Ade, MD    MDM  Patient is a 42 YOM with no medical history who presents with a chief complaint of RLQ to back pain. Patient has been with increased activity at work including lifting and felt a muscle spasm in his back. He tried to sleep it off but today had RLQ pain. CT results pending at time of handoff and ultimately unremarkable. Given the overall reassuring exam and results, patient is stable for ambulatory management with anti-inflammatories and follow up with the PCP.      Glyn Ade, MD 06/06/20 0007    Tegeler, Canary Brim, MD 06/06/20 [phone]

## 2020-06-05 NOTE — ED Provider Notes (Signed)
[facility] EMERGENCY DEPARTMENT Provider Note   CSN: [id number] Arrival date & time: 06/05/20  1308     History Flank pain   Mason Barnes is a 50 y.o. male with past medical history who presents for evaluation of neck pain.  Worse with movement.  Occasionally radiates into his right lower abdomen.  No history of stones.  Describes pain as aching and burning.  Occasionally radiate down his back however no extension into his legs, buttocks.  No chest pain, shortness of breath.  Has not taken anything for this.  No testicular pain, dysuria, hematuria.  No fever, chills, nausea, vomiting, chest pain, shortness of breath, difficulty urinating, paresthesias, weakness, IV drug use, bowel or bladder incontinence, saddle paresthesia.  Rates his pain 8/10.  No rashes or lesions.  History obtained from patient and past medical records.  No interpreter used  HPI     Past Medical History:  Diagnosis Date  . Medical history non-contributory     There are no problems to display for this patient.   No past surgical history on file.     No family history on file.  Social History   Tobacco Use  . Smoking status: Never Smoker  . Smokeless tobacco: Never Used  Substance Use Topics  . Alcohol use: Never  . Drug use: Never    Home Medications Prior to Admission medications   Medication Sig Start Date End Date Taking? Authorizing Provider  albuterol (PROVENTIL HFA;VENTOLIN HFA) 108 (90 Base) MCG/ACT inhaler Inhale 2 puffs into the lungs every 4 (four) hours as needed for wheezing or shortness of breath. 01/27/18   Eber Hong, MD  amoxicillin-clavulanate (AUGMENTIN) 875-125 MG tablet Take 1 tablet by mouth every 12 (twelve) hours. 01/27/18   Eber Hong, MD  benzonatate (TESSALON) 100 MG capsule Take 1 capsule (100 mg total) by mouth every 8 (eight) hours. 02/05/18   Charlynne Pander, MD  dicyclomine (BENTYL) 20 MG tablet Take 1 tablet (20 mg total) by mouth every  8 (eight) hours as needed for spasms. 11/20/19   Petrucelli, Samantha R, PA-C  ondansetron (ZOFRAN ODT) 4 MG disintegrating tablet Take 1 tablet (4 mg total) by mouth every 8 (eight) hours as needed for nausea or vomiting. 11/20/19   Petrucelli, Lelon Mast R, PA-C  predniSONE (DELTASONE) 10 MG tablet 6,5,4,3,2,1 taper 02/04/18   Elson Areas, PA-C    Allergies    Patient has no known allergies.  Review of Systems   Review of Systems  Constitutional: Negative.   HENT: Negative.   Respiratory: Negative.   Cardiovascular: Negative.   Gastrointestinal: Positive for abdominal pain. Negative for abdominal distention, anal bleeding, blood in stool, constipation, diarrhea, nausea, rectal pain and vomiting.  Genitourinary: Positive for flank pain. Negative for decreased urine volume, difficulty urinating, dysuria, frequency, hematuria, penile discharge, penile swelling, scrotal swelling, testicular pain and urgency.  Musculoskeletal: Positive for back pain.  Skin: Negative.   Neurological: Negative.   All other systems reviewed and are negative.   Physical Exam Updated Vital Signs BP (!) 152/88 (BP Location: Left Arm)   Pulse 69   Temp 98 F (36.7 C) (Oral)   Resp 16   Ht 5' 8.11" (1.73 m)   Wt 83.9 kg   SpO2 100%   BMI 28.04 kg/m   Physical Exam Vitals and nursing note reviewed.  Constitutional:      General: He is not in acute distress.    Appearance: He is well-developed. He is not ill-appearing,  toxic-appearing or diaphoretic.  HENT:     Head: Normocephalic and atraumatic.     Nose: Nose normal.     Mouth/Throat:     Mouth: Mucous membranes are moist.  Eyes:     Pupils: Pupils are equal, round, and reactive to light.  Cardiovascular:     Rate and Rhythm: Normal rate and regular rhythm.     Pulses: Normal pulses.     Heart sounds: Normal heart sounds.  Pulmonary:     Effort: Pulmonary effort is normal. No respiratory distress.     Breath sounds: Normal breath sounds.      Comments: Speaks in full sentences without difficulty.  Clear to auscultation bilaterally Abdominal:     General: Bowel sounds are normal. There is no distension.     Palpations: Abdomen is soft.     Tenderness: There is no abdominal tenderness. There is right CVA tenderness. There is no left CVA tenderness, guarding or rebound.     Comments: Positive CVA tap on right.  Abdomen soft.  No rebound or guarding  Musculoskeletal:        General: Normal range of motion.     Cervical back: Normal range of motion and neck supple.     Comments: Moves all 4 extremities at difficulty.  Very mild right paraspinal lumbar tenderness.  Negative straight leg raise bilaterally.  Pelvis stable, nontender palpation.  No midline spinal tenderness.  No shortening or rotation of legs  Skin:    General: Skin is warm and dry.     Capillary Refill: Capillary refill takes less than 2 seconds.     Comments: No rashes, vesicles or lesion  Neurological:     General: No focal deficit present.     Mental Status: He is alert and oriented to person, place, and time.     Cranial Nerves: No cranial nerve deficit.     Sensory: No sensory deficit.     Motor: No weakness.     Coordination: Coordination normal.     Gait: Gait normal.     Comments: Intact sensation Ambulatory without difficulty     ED Results / Procedures / Treatments   Labs (all labs ordered are listed, but only abnormal results are displayed) Labs Reviewed  BASIC METABOLIC PANEL - Abnormal; Notable for the following components:      Result Value   Glucose, Bld 111 (*)    All other components within normal limits  CBC WITH DIFFERENTIAL/PLATELET  URINALYSIS, ROUTINE W REFLEX MICROSCOPIC    EKG None  Radiology No results found.  Procedures Procedures   Medications Ordered in ED Medications  ondansetron (ZOFRAN) injection 4 mg (4 mg Intravenous Given 06/05/20 1527)  sodium chloride 0.9 % bolus 500 mL (500 mLs Intravenous New Bag/Given 06/05/20  1532)  morphine 4 MG/ML injection 4 mg (4 mg Intravenous Given 06/05/20 1527)    ED Course  I have reviewed the triage vital signs and the nursing notes.  Pertinent labs & imaging results that were available during my care of the patient were reviewed by me and considered in my medical decision making (see chart for details).  50 year old here for evaluation of right flank and occasional right abdominal pain.  Pain also radiating to his lower back.  He is afebrile, nonseptic, not ill-appearing.  His heart lungs are clear.  Abdomen is soft.  He has positive CVA tap on right.  Does have significant pain with light palpation to his skin however no obvious rash to  indicate zoster.  Denies any testicular or urinary complaints. NV intact.  I can reproduce most of his pain.  No midline pulsatile abdominal mass to suggest dissection, AAA.  Work-up started from triage  Labs personally reviewed and interpreted:  CBC without leukocytosis BMP with glucose at 111 no additional electrolyte renal or liver abnormality CT AP pending  Care transferred to Resident Dr. Doran Durand who will follow up on imaging and dispo apparently  Clinical Course as of 06/05/20 1543  Mon Jun 05, 2020  1540 Handoff: RLQ pain. Minimal history. CT AP with contrast.  [CC]    Clinical Course User Index [CC] Glyn Ade, MD   MDM Rules/Calculators/A&P                           Final Clinical Impression(s) / ED Diagnoses Final diagnoses:  Flank pain    Rx / DC Orders ED Discharge Orders    None       Ed Mandich A, PA-C 06/05/20 1544    Benjiman Core, MD 06/06/20 1446

## 2020-06-05 NOTE — Discharge Instructions (Signed)
You may use tylenol 1000mg  every 6 hours and ibuprofen 400 mg every 6 hours for up to 10 days. Please schedule an appointment with a local primary care physician.

## 2020-06-05 NOTE — ED Triage Notes (Signed)
Pt with three days of R flank/back pain without injury. Denies urinary problems.

## 2020-06-05 NOTE — ED Triage Notes (Signed)
Emergency Medicine Provider Triage Evaluation Note  Mason Barnes , a 50 y.o. male  was evaluated in triage.  Pt complain s of Lower back pain for three days on the right side.  First he thought it was a sprain and now is up his back.  Feels like its burning.  Denies injury. No urinary symptoms. Pain is 8/10.  No pain in chest or abdomen.  Review of Systems  Positive: Back pain, right sided Negative: Fevers, urinary symptoms  Physical Exam  BP 130/86 (BP Location: Left Arm)   Pulse 70   Temp 98 F (36.7 C) (Oral)   Resp 16   SpO2 99%  Gen:   Awake, no distress   HEENT:  Atraumatic  Resp:  Normal effort  Cardiac:  Normal rate  Abd:   Nondistended, nontender  MSK:   Moves extremities without difficulty, patient has pain with palpation of right sided paraspinal muscles Neuro:  Speech clear   Medical Decision Making  Medically screening exam initiated at 1:15 PM.  Appropriate orders placed.  Mason Barnes was informed that the remainder of the evaluation will be completed by another provider, this initial triage assessment does not replace that evaluation, and the importance of remaining in the ED until their evaluation is complete.  Clinical Impression  Left sided back pain   Cristina Gong, [place] 06/05/20 1320

## 2021-02-20 ENCOUNTER — Emergency Department (HOSPITAL_COMMUNITY): Payer: 59

## 2021-02-20 ENCOUNTER — Emergency Department (HOSPITAL_COMMUNITY)
Admission: EM | Admit: 2021-02-20 | Discharge: 2021-02-20 | Disposition: A | Discharge status: Home / Self Care | Payer: 59 | Attending: Student | Admitting: Student

## 2021-02-20 DIAGNOSIS — R1033 Periumbilical pain: Secondary | ICD-10-CM | POA: Diagnosis not present

## 2021-02-20 DIAGNOSIS — R1012 Left upper quadrant pain: Secondary | ICD-10-CM | POA: Insufficient documentation

## 2021-02-20 LAB — CBC WITH DIFFERENTIAL/PLATELET
Abs Immature Granulocytes: 0.02 10*3/uL (ref 0.00–0.07)
Basophils Absolute: 0 10*3/uL (ref 0.0–0.1)
Basophils Relative: 1 %
Eosinophils Absolute: 0.1 10*3/uL (ref 0.0–0.5)
Eosinophils Relative: 2 %
HCT: 43.1 % (ref 39.0–52.0)
Hemoglobin: 14.5 g/dL (ref 13.0–17.0)
Immature Granulocytes: 0 %
Lymphocytes Relative: 40 %
Lymphs Abs: 2.4 10*3/uL (ref 0.7–4.0)
MCH: 29.9 pg (ref 26.0–34.0)
MCHC: 33.6 g/dL (ref 30.0–36.0)
MCV: 88.9 fL (ref 80.0–100.0)
Monocytes Absolute: 0.5 10*3/uL (ref 0.1–1.0)
Monocytes Relative: 8 %
Neutro Abs: 2.9 10*3/uL (ref 1.7–7.7)
Neutrophils Relative %: 49 %
Platelets: 211 10*3/uL (ref 150–400)
RBC: 4.85 MIL/uL (ref 4.22–5.81)
RDW: 12.2 % (ref 11.5–15.5)
WBC: 5.9 10*3/uL (ref 4.0–10.5)
nRBC: 0 % (ref 0.0–0.2)

## 2021-02-20 LAB — COMPREHENSIVE METABOLIC PANEL
ALT: 28 U/L (ref 0–44)
AST: 21 U/L (ref 15–41)
Albumin: 4.1 g/dL (ref 3.5–5.0)
Alkaline Phosphatase: 39 U/L (ref 38–126)
Anion gap: 6 (ref 5–15)
BUN: 7 mg/dL (ref 6–20)
CO2: 27 mmol/L (ref 22–32)
Calcium: 9.4 mg/dL (ref 8.9–10.3)
Chloride: 105 mmol/L (ref 98–111)
Creatinine, Ser: 0.71 mg/dL (ref 0.61–1.24)
GFR, Estimated: >60 mL/min (ref >60)
Glucose, Bld: 136 mg/dL — ABNORMAL HIGH (ref 70–99)
Potassium: 4 mmol/L (ref 3.5–5.1)
Sodium: 138 mmol/L (ref 135–145)
Total Bilirubin: 0.7 mg/dL (ref 0.3–1.2)
Total Protein: 6.7 g/dL (ref 6.5–8.1)

## 2021-02-20 LAB — LIPASE, BLOOD: Lipase: 56 U/L — ABNORMAL HIGH (ref 11–51)

## 2021-02-20 MED ORDER — SODIUM CHLORIDE 0.9 % IV BOLUS
1000.0000 mL | Freq: Once | INTRAVENOUS | Status: AC
  Administered 2021-02-20: 21:00:00 1000 mL via INTRAVENOUS

## 2021-02-20 MED ORDER — IOHEXOL 300 MG/ML  SOLN
100.0000 mL | Freq: Once | INTRAMUSCULAR | Status: AC | PRN
  Administered 2021-02-20: 21:00:00 100 mL via INTRAVENOUS

## 2021-02-20 MED ORDER — ONDANSETRON HCL 4 MG/2ML IJ SOLN
4.0000 mg | Freq: Once | INTRAMUSCULAR | Status: AC
  Administered 2021-02-20: 21:00:00 4 mg via INTRAVENOUS
  Filled 2021-02-20: qty 2

## 2021-02-20 MED ORDER — FENTANYL CITRATE PF 50 MCG/ML IJ SOSY
25.0000 ug | PREFILLED_SYRINGE | Freq: Once | INTRAMUSCULAR | Status: AC
  Administered 2021-02-20: 21:00:00 25 ug via INTRAVENOUS
  Filled 2021-02-20: qty 1

## 2021-02-20 NOTE — ED Triage Notes (Signed)
Pt c/o abd pain x5 days. Pt also endorses new-onset HA. Denies sx hx, all abdominal organs intact. Tender to palpation   Interpreter used in triage

## 2021-02-20 NOTE — ED Provider Notes (Signed)
[facility] Provider Note   CSN: [id number] Arrival date & time: 02/20/21  1643     History Chief Complaint  Patient presents with   Abdominal Pain    Mason Barnes is a 50 y.o. male.  50 year old with no past medical history presents to the ED with a chief complaint of left upper quadrant pain has been constant for the past 5 days.  Patient describes a sharp sensation to the left upper quadrant with some radiation into his periumbilical area.  There is no alleviating or exacerbating factors.  He does endorse some nausea but has not had any episodes of vomiting.  He took "a painkiller ", for symptomatic relief without much improvement.  He also endorses very bitter sensation to his mouth.  Not have any prior surgical history.  Denies any alcohol use due to religion.  No fevers, no vomiting, no urinary symptoms.   The history is provided by the patient and medical records. A language interpreter was used.  Abdominal Pain Pain location:  LUQ Pain quality: aching   Pain severity:  Moderate Onset quality:  Gradual Duration:  5 days Timing:  Constant Progression:  Worsening Chronicity:  New Context: not alcohol use, not awakening from sleep, not diet changes, not eating, not recent illness, not recent sexual activity, not retching and not sick contacts   Associated symptoms: nausea   Associated symptoms: no chest pain, no chills, no constipation, no diarrhea, no fever, no shortness of breath, no sore throat and no vomiting       Past Medical History:  Diagnosis Date   Medical history non-contributory     There are no problems to display for this patient.   No past surgical history on file.     No family history on file.  Social History   Tobacco Use   Smoking status: Never   Smokeless tobacco: Never  Substance Use Topics   Alcohol use: Never   Drug use: Never    Home Medications Prior to Admission medications    Medication Sig Start Date End Date Taking? Authorizing Provider  albuterol (PROVENTIL HFA;VENTOLIN HFA) 108 (90 Base) MCG/ACT inhaler Inhale 2 puffs into the lungs every 4 (four) hours as needed for wheezing or shortness of breath. 01/27/18   Noemi Chapel, MD  amoxicillin-clavulanate (AUGMENTIN) 875-125 MG tablet Take 1 tablet by mouth every 12 (twelve) hours. 01/27/18   Noemi Chapel, MD  benzonatate (TESSALON) 100 MG capsule Take 1 capsule (100 mg total) by mouth every 8 (eight) hours. 02/05/18   Drenda Freeze, MD  dicyclomine (BENTYL) 20 MG tablet Take 1 tablet (20 mg total) by mouth every 8 (eight) hours as needed for spasms. 11/20/19   Petrucelli, Samantha R, PA-C  ondansetron (ZOFRAN ODT) 4 MG disintegrating tablet Take 1 tablet (4 mg total) by mouth every 8 (eight) hours as needed for nausea or vomiting. 11/20/19   Petrucelli, Aldona Bar R, PA-C  predniSONE (DELTASONE) 10 MG tablet 6,5,4,3,2,1 taper 02/04/18   Fransico Meadow, PA-C    Allergies    Patient has no known allergies.  Review of Systems   Review of Systems  Constitutional:  Negative for chills and fever.  HENT:  Negative for sore throat.   Respiratory:  Negative for shortness of breath.   Cardiovascular:  Negative for chest pain.  Gastrointestinal:  Positive for abdominal pain and nausea. Negative for blood in stool, constipation, diarrhea and vomiting.  Genitourinary:  Negative for difficulty urinating, flank pain, penile  swelling and scrotal swelling.  Musculoskeletal:  Negative for back pain.  Skin:  Negative for pallor and wound.  Neurological:  Negative for light-headedness and headaches.  All other systems reviewed and are negative.  Physical Exam Updated Vital Signs BP 120/70 (BP Location: Right Arm)    Pulse 70    Temp 98 F (36.7 C) (Oral)    Resp 15    SpO2 98%   Physical Exam Vitals and nursing note reviewed.  Constitutional:      Appearance: He is well-developed.  HENT:     Head: Normocephalic and  atraumatic.  Eyes:     General: No scleral icterus.    Pupils: Pupils are equal, round, and reactive to light.  Cardiovascular:     Heart sounds: Normal heart sounds.  Pulmonary:     Effort: Pulmonary effort is normal.     Breath sounds: Normal breath sounds. No wheezing.  Chest:     Chest wall: No tenderness.  Abdominal:     General: Bowel sounds are decreased. There is no distension.     Palpations: Abdomen is soft.     Tenderness: There is abdominal tenderness in the epigastric area and left upper quadrant. There is guarding. Negative signs include Murphy's sign and McBurney's sign.     Hernia: No hernia is present.  Musculoskeletal:        General: No tenderness or deformity.     Cervical back: Normal range of motion.  Skin:    General: Skin is warm and dry.  Neurological:     Mental Status: He is alert and oriented to person, place, and time.    ED Results / Procedures / Treatments   Labs (all labs ordered are listed, but only abnormal results are displayed) Labs Reviewed  COMPREHENSIVE METABOLIC PANEL - Abnormal; Notable for the following components:      Result Value   Glucose, Bld 136 (*)    All other components within normal limits  LIPASE, BLOOD - Abnormal; Notable for the following components:   Lipase 56 (*)    All other components within normal limits  CBC WITH DIFFERENTIAL/PLATELET  URINALYSIS, ROUTINE W REFLEX MICROSCOPIC    EKG None  Radiology CT Abdomen Pelvis W Contrast  Result Date: 02/20/2021 CLINICAL DATA:  LLQ abdominal pain. abd pain x5 days. Pt also endorses new-onset HA. Denies sx hx, all abdominal organs intact. Tender to palpation EXAM: CT ABDOMEN AND PELVIS WITH CONTRAST TECHNIQUE: Multidetector CT imaging of the abdomen and pelvis was performed using the standard protocol following bolus administration of intravenous contrast. CONTRAST:  OMNIPAQUE IOHEXOL 300 MG/ML  SOLN COMPARISON:  None. FINDINGS: Lower chest: No acute abnormality.  Hepatobiliary: No focal liver abnormality. No gallstones, gallbladder wall thickening, or pericholecystic fluid. No biliary dilatation. Pancreas: No focal lesion. Normal pancreatic contour. No surrounding inflammatory changes. No main pancreatic ductal dilatation. Spleen: Normal in size without focal abnormality. Adrenals/Urinary Tract: No adrenal nodule bilaterally. Bilateral kidneys enhance symmetrically. Subcentimeter hypodensity too small to characterize. No hydronephrosis. No hydroureter.  Punctate right nephrolithiasis. The urinary bladder is unremarkable. On delayed imaging, there is no urothelial wall thickening and there are no filling defects in the opacified portions of the bilateral collecting systems or ureters. Stomach/Bowel: Stomach is within normal limits. No evidence of bowel wall thickening or dilatation. The appendix is not definitely identified with no inflammatory changes in the right lower quadrant to suggest acute appendicitis. Vascular/Lymphatic: No abdominal aorta or iliac aneurysm. Mild atherosclerotic plaque of the aorta  and its branches. No abdominal, pelvic, or inguinal lymphadenopathy. Reproductive: Prostate is unremarkable. Other: No intraperitoneal free fluid. No intraperitoneal free gas. No organized fluid collection. Musculoskeletal: No abdominal wall hernia or abnormality. No suspicious lytic or blastic osseous lesions. No acute displaced fracture. IMPRESSION: 1. Nonobstructive punctate right nephrolithiasis. 2. No acute intra-abdominal or intrapelvic abnormality. Electronically Signed   By: Iven Finn M.D.   On: 02/20/2021 21:46    Procedures Procedures   Medications Ordered in ED Medications  sodium chloride 0.9 % bolus 1,000 mL (1,000 mLs Intravenous New Bag/Given 02/20/21 2105)  ondansetron (ZOFRAN) injection 4 mg (4 mg Intravenous Given 02/20/21 2106)  fentaNYL (SUBLIMAZE) injection 25 mcg (25 mcg Intravenous Given 02/20/21 2107)  iohexol (OMNIPAQUE) 300 MG/ML  solution 100 mL (100 mLs Intravenous Contrast Given 02/20/21 2124)    ED Course  I have reviewed the triage vital signs and the nursing notes.  Pertinent labs & imaging results that were available during my care of the patient were reviewed by me and considered in my medical decision making (see chart for details).    MDM Rules/Calculators/A&P   Patient with no pertinent past medical history, or pertinent past surgical history presents to the ED with left upper quadrant abdominal pain for the past 5 days.  No alleviating factors.  Arrived in the ED with stable vital signs.  During evaluation patient is nontoxic-appearing, there is significant tenderness to palpation on the left upper quadrant, with some guarding noted.  No pain along McBurney's point, no Murphy's sign.  Denies any prior history of alcohol use due to religion.  No prior history of pancreatitis.  Denies any shortness of breath, chest pain.  His lungs are clear to auscultation, moves all upper and lower extremities.  Laboratory screening was ordered in triage.  This was reviewed by me, CBC with no leukocytosis, hemoglobin is within normal limits.  CMP without any electrolyte derangement, creatinine level is within normal limits.  LFTs are unremarkable.  Denies any urinary symptoms such as dysuria, hematuria or flank pain.  UA is currently pending.  Lipase level slightly elevated at 56, so endorsing some nausea.  Given Zofran, fentanyl, bolus for symptomatic treatment.  CT angio without any acute findings.  I did discuss results at length with patient, he was given a copy of his results.  We discussed discontinuing UA as he does not have any urinary symptoms on today's visit.  We also discussed following up with PCP for further management.  His vitals have remained stable, he is afebrile without any tachycardia or hypoxia.  Patient hemodynamically stable for discharge.   Portions of this note were generated with Theatre manager. Dictation errors may occur despite best attempts at proofreading.  Final Clinical Impression(s) / ED Diagnoses Final diagnoses:  Left upper quadrant pain    Rx / DC Orders ED Discharge Orders     None        Janeece Fitting, Hershal Coria 02/20/21 2249    Isla Pence, MD 02/20/21 2313

## 2021-02-20 NOTE — ED Notes (Signed)
Patient verbalizes understanding of discharge instructions. Opportunity for questioning and answers were provided. Armband removed by staff, pt discharged from ED ambulatory.   

## 2021-02-20 NOTE — ED Provider Notes (Addendum)
Emergency Medicine Provider Triage Evaluation Note  Mason Barnes , a 50 y.o. male  was evaluated in triage.  Pt complains of abdominal pain x5 days.  Pain began in the left lower quadrant, now more generalized.  He has an associated headache and nausea.  No previous abdominal surgery history.  No urinary symptoms.  Arabic interpreter utilized.  Review of Systems  Positive: Abdominal pain, nausea Negative: Fever  Physical Exam  BP 132/83 (BP Location: Right Arm)    Pulse 79    Temp 98 F (36.7 C) (Oral)    Resp 16    SpO2 98%  Gen:   Awake, no distress   Resp:  Normal effort  MSK:   Moves extremities without difficulty  Other:  Patient with generalized abdominal pain, worse in the periumbilical area bilaterally.  Tenderness is moderate.  + guarding.     Medical Decision Making  Medically screening exam initiated at 4:55 PM.  Appropriate orders placed.  Mason Barnes was informed that the remainder of the evaluation will be completed by another provider, this initial triage assessment does not replace that evaluation, and the importance of remaining in the ED until their evaluation is complete.  Labs, CT imaging ordered.   Renne Crigler, PA-C 02/20/21 1656    Renne Crigler, PA-C 02/20/21 1656    Glendora Score, MD 02/20/21 [phone]

## 2021-02-20 NOTE — Discharge Instructions (Addendum)
Your laboratory results are within normal limits today.  We discussed following up with primary care physician.  If you continue to experience pain you may need to return to the emergency department.
